# Patient Record
Sex: Male | Born: 1975 | Race: Black or African American | Hispanic: No | Marital: Single | Smoking: Current every day smoker
Health system: Southern US, Community
[De-identification: ages and names within clinical notes are randomized; demographics above are authoritative.]

## PROBLEM LIST (undated history)

## (undated) DIAGNOSIS — W3400XA Accidental discharge from unspecified firearms or gun, initial encounter: Secondary | ICD-10-CM

## (undated) HISTORY — PX: ABDOMINAL SURGERY: SHX537

---

## 1999-11-04 ENCOUNTER — Inpatient Hospital Stay (HOSPITAL_COMMUNITY): Admission: EM | Admit: 1999-11-04 | Discharge: 1999-11-10 | Payer: Self-pay

## 2002-07-19 ENCOUNTER — Emergency Department (HOSPITAL_COMMUNITY): Admission: EM | Admit: 2002-07-19 | Discharge: 2002-07-19 | Payer: Self-pay | Admitting: Emergency Medicine

## 2003-01-06 ENCOUNTER — Emergency Department (HOSPITAL_COMMUNITY): Admission: EM | Admit: 2003-01-06 | Discharge: 2003-01-06 | Payer: Self-pay

## 2003-01-18 ENCOUNTER — Emergency Department (HOSPITAL_COMMUNITY): Admission: EM | Admit: 2003-01-18 | Discharge: 2003-01-18 | Payer: Self-pay | Admitting: Emergency Medicine

## 2003-08-25 ENCOUNTER — Inpatient Hospital Stay (HOSPITAL_COMMUNITY): Admission: AC | Admit: 2003-08-25 | Discharge: 2003-08-29 | Payer: Self-pay

## 2005-06-27 ENCOUNTER — Emergency Department (HOSPITAL_COMMUNITY): Admission: EM | Admit: 2005-06-27 | Discharge: 2005-06-27 | Payer: Self-pay | Admitting: Emergency Medicine

## 2009-07-09 ENCOUNTER — Emergency Department (HOSPITAL_COMMUNITY): Admission: EM | Admit: 2009-07-09 | Discharge: 2009-07-09 | Payer: Self-pay | Admitting: Emergency Medicine

## 2009-08-22 ENCOUNTER — Ambulatory Visit (HOSPITAL_COMMUNITY): Admission: EM | Admit: 2009-08-22 | Discharge: 2009-08-22 | Payer: Self-pay | Admitting: Emergency Medicine

## 2010-03-21 LAB — DIFFERENTIAL
Basophils Absolute: 0.1 10*3/uL (ref 0.0–0.1)
Lymphocytes Relative: 23 % (ref 12–46)
Neutro Abs: 9.1 10*3/uL — ABNORMAL HIGH (ref 1.7–7.7)
Neutrophils Relative %: 66 % (ref 43–77)

## 2010-03-21 LAB — COMPREHENSIVE METABOLIC PANEL
Albumin: 4 g/dL (ref 3.5–5.2)
BUN: 7 mg/dL (ref 6–23)
CO2: 26 mEq/L (ref 19–32)
Chloride: 106 mEq/L (ref 96–112)
Creatinine, Ser: 1.21 mg/dL (ref 0.4–1.5)
GFR calc non Af Amer: >60 mL/min (ref >60)
Total Bilirubin: 1 mg/dL (ref 0.3–1.2)

## 2010-03-21 LAB — CBC
MCH: 33.7 pg (ref 26.0–34.0)
MCV: 97.8 fL (ref 78.0–100.0)
Platelets: 279 10*3/uL (ref 150–400)
RBC: 4.55 MIL/uL (ref 4.22–5.81)

## 2010-03-21 LAB — RAPID URINE DRUG SCREEN, HOSP PERFORMED
Barbiturates: NOT DETECTED
Benzodiazepines: NOT DETECTED
Cocaine: POSITIVE — AB
Opiates: POSITIVE — AB

## 2010-03-23 LAB — GC/CHLAMYDIA PROBE AMP, URINE
Chlamydia, Swab/Urine, PCR: NEGATIVE
GC Probe Amp, Urine: NEGATIVE
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
## 2013-09-18 ENCOUNTER — Emergency Department (HOSPITAL_COMMUNITY): Payer: Self-pay

## 2013-09-18 ENCOUNTER — Emergency Department (HOSPITAL_COMMUNITY)
Admission: EM | Admit: 2013-09-18 | Discharge: 2013-09-18 | Disposition: A | Discharge status: Home / Self Care | Payer: Self-pay | Attending: Emergency Medicine | Admitting: Emergency Medicine

## 2013-09-18 ENCOUNTER — Encounter (HOSPITAL_COMMUNITY): Payer: Self-pay | Admitting: Emergency Medicine

## 2013-09-18 DIAGNOSIS — Y92410 Unspecified street and highway as the place of occurrence of the external cause: Secondary | ICD-10-CM | POA: Insufficient documentation

## 2013-09-18 DIAGNOSIS — F17200 Nicotine dependence, unspecified, uncomplicated: Secondary | ICD-10-CM | POA: Insufficient documentation

## 2013-09-18 DIAGNOSIS — S46909A Unspecified injury of unspecified muscle, fascia and tendon at shoulder and upper arm level, unspecified arm, initial encounter: Secondary | ICD-10-CM | POA: Insufficient documentation

## 2013-09-18 DIAGNOSIS — S4980XA Other specified injuries of shoulder and upper arm, unspecified arm, initial encounter: Secondary | ICD-10-CM | POA: Insufficient documentation

## 2013-09-18 DIAGNOSIS — M25512 Pain in left shoulder: Secondary | ICD-10-CM

## 2013-09-18 DIAGNOSIS — Y9389 Activity, other specified: Secondary | ICD-10-CM | POA: Insufficient documentation

## 2013-09-18 HISTORY — DX: Accidental discharge from unspecified firearms or gun, initial encounter: W34.00XA

## 2013-09-18 MED ORDER — HYDROCODONE-ACETAMINOPHEN 5-325 MG PO TABS: 1.0000 | ORAL_TABLET | Freq: Four times a day (QID) | ORAL | Status: AC | PRN

## 2013-09-18 NOTE — ED Provider Notes (Signed)
Medical screening examination/treatment/procedure(s) were performed by non-physician practitioner and as supervising physician I was immediately available for consultation/collaboration.   EKG Interpretation None        Audree Camel, MD 09/18/13 [phone]

## 2013-09-18 NOTE — Discharge Instructions (Signed)

## 2013-09-18 NOTE — Progress Notes (Signed)
[facility] Community Liaison Stacy, ° °Did not get to see patient but will be sending information about [facility] Orange Card program to help patient establish primary care, using the address provided.  °
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
## 2015-02-03 ENCOUNTER — Emergency Department (HOSPITAL_COMMUNITY)
Admission: EM | Admit: 2015-02-03 | Discharge: 2015-02-03 | Disposition: A | Discharge status: Home / Self Care | Payer: Self-pay | Attending: Emergency Medicine | Admitting: Emergency Medicine

## 2015-02-03 ENCOUNTER — Encounter (HOSPITAL_COMMUNITY): Payer: Self-pay

## 2015-02-03 DIAGNOSIS — Z87828 Personal history of other (healed) physical injury and trauma: Secondary | ICD-10-CM | POA: Insufficient documentation

## 2015-02-03 DIAGNOSIS — F17210 Nicotine dependence, cigarettes, uncomplicated: Secondary | ICD-10-CM | POA: Insufficient documentation

## 2015-02-03 DIAGNOSIS — L259 Unspecified contact dermatitis, unspecified cause: Secondary | ICD-10-CM | POA: Insufficient documentation

## 2015-02-03 MED ORDER — FAMOTIDINE 20 MG PO TABS
40.0000 mg | ORAL_TABLET | Freq: Once | ORAL | Status: AC
  Administered 2015-02-03: 40 mg via ORAL
  Filled 2015-02-03: qty 2

## 2015-02-03 MED ORDER — PREDNISONE 20 MG PO TABS: 40.0000 mg | ORAL_TABLET | Freq: Every day | ORAL | Status: AC

## 2015-02-03 MED ORDER — DIPHENHYDRAMINE HCL 25 MG PO CAPS
25.0000 mg | ORAL_CAPSULE | Freq: Once | ORAL | Status: AC
  Administered 2015-02-03: 25 mg via ORAL
  Filled 2015-02-03: qty 1

## 2015-02-03 MED ORDER — PREDNISONE 20 MG PO TABS
60.0000 mg | ORAL_TABLET | Freq: Once | ORAL | Status: AC
  Administered 2015-02-03: 60 mg via ORAL
  Filled 2015-02-03: qty 3

## 2015-02-03 MED ORDER — FAMOTIDINE 20 MG PO TABS: 20.0000 mg | ORAL_TABLET | Freq: Two times a day (BID) | ORAL | Status: AC | PRN

## 2015-02-03 NOTE — Discharge Instructions (Signed)
Read instructions below to learn more about your diagnosis and for reasons to return to the ED. Follow up with your doctor if symptoms are not improving after 3-4 days. If you do not have a doctor use the resource guide listed below to help he find one. You may return to the emergency department if symptoms worsen, become progressive, or become more concerning. Take prednisone as directed. Take benadryl and pepcid as needed for swelling and/or itching.   Allergic Reaction  There are many allergens around [place]. It may be difficult to know what caused your reaction. You may follow up with an allergy specialist for further testing to learn more about your specific allergies.   TREATMENT AND HOME CARE INSTRUCTIONS  If hives or rash are present:  Use an over-the-counter antihistamine (Benadryl or Zyrtec) for hives and itching as needed. Do not drive or drink alcohol until medications used to treat the reaction have worn off. Antihistamines tend to make people sleepy.  Apply cold cloths (compresses) to the skin or take baths in cool water. This will help itching. Avoid hot baths or showers. Heat will make a rash and itching worse.  See Your Primary Care Doctor if:  Your allergies are becoming progressively more troublesome.  You suspect a food allergy. Symptoms generally happen within 30 minutes of eating a food.  Your symptoms have not gone away within 2 days.  SEEK IMMEDIATE MEDICAL CARE IF:  You develop difficulty breathing or wheezing, or have a tight feeling in your chest or throat (feeling like your throat is closing) You develop swollen lips or tongue You develop hives on your chest, neck or face. You are unable to swallow fluids or salvia secretions.   A severe reaction with any of the above problems should be considered life-threatening. If you suddenly develop difficulty breathing call for local emergency medical help. THIS IS AN EMERGENCY.    Emergency Department Resource Guide 1) Find a  Doctor and Pay Out of Pocket Although you won't have to find out who is covered by your insurance plan, it is a good idea to ask around and get recommendations. You will then need to call the office and see if the doctor you have chosen will accept you as a new patient and what types of options they offer for patients who are self-pay. Some doctors offer discounts or will set up payment plans for their patients who do not have insurance, but you will need to ask so you aren't surprised when you get to your appointment.  2) Contact Your Local Health Department Not all health departments have doctors that can see patients for sick visits, but many do, so it is worth a call to see if yours does. If you don't know where your local health department is, you can check in your phone book. The CDC also has a tool to help you locate your state's health department, and many state websites also have listings of all of their local health departments.  3) Find a Walk-in Clinic If your illness is not likely to be very severe or complicated, you may want to try a walk in clinic. These are popping up all over the country in pharmacies, drugstores, and shopping centers. They're usually staffed by nurse practitioners or physician assistants that have been trained to treat common illnesses and complaints. They're usually fairly quick and inexpensive. However, if you have serious medical issues or chronic medical problems, these are probably not your best option.  No Primary  Care Doctor: - Call Health Connect at  [phone] - they can help you locate a primary care doctor that  accepts your insurance, provides certain services, etc. - Physician Referral Service- [phone]  Chronic Pain Problems: Organization         Address  Phone   Notes  [facility]  [phone] Patients need to be referred by their primary care doctor.   Medication Assistance: Organization         Address  Phone    Notes  [facility] Medication [facility] [address] [place] [place], [place] [address] [phone] --Must be a resident of [facility] -- Must have NO insurance coverage whatsoever (no Medicaid/ Medicare, etc.) -- The pt. MUST have a primary care doctor that directs their care regularly and follows them in the community   [facility]ist  [phone]   [employer]  [phone]    Agencies that provide inexpensive medical care: Organization         Address  Phone   Notes  [facility]  [phone]   [facility]    [phone]   [facility] [address] [place], [place] [address] [phone]   [facility] [address] [place] [place] [phone]   Planned Parenthood    [phone]   [facility]    [phone]   [facility]  [address], [place] Phone:  [phone], Fax:  [phone] Hours of Operation:  9 am - 6 pm, M-F.  Also accepts Medicaid/Medicare and self-pay.  [facility]  [address], [place] Phone: [phone], Fax: [phone]. Hours of Operation:  8:30 am - 5:30 pm, M-F.  Also accepts Medicaid and self-pay.  [facility] [place] [address] [place] Phone: [phone]   [facility] [address] Natasha Bence [place], [place] [phone], Ext. [phone] Mondays & Thursdays: 7-9 AM.  First 15 patients are seen on a first come, first serve basis.    Medicaid-accepting [facility] Providers:  Organization         Address  Phone   Notes  [facility] [address], [place] [phone] Also accepts self-pay patients.  [facility] [address] Laurell Josephs [place], [place]  [phone]   [facility] [address], [place] [phone]   [facility] [address] [place] [phone]   Renaye Rakers [address], [place]   [phone] Only accepts [place] Access [place] patients after they have their name applied to their card.   Self-Pay (no insurance) in [facility]:  Organization         Address  Phone   Notes  Sickle Cell Patients, [facility] [address] [place], [place] [phone]   [facility] [address] [place], [place] [phone]   [facility] [place]  [address], [place] [phone]   [facility]/Dr. Osei-Bonsu  [address], [place] or [address], [place] [phone] Phone number for both [place] and [place] locations is the same.  [facility] [address], [place] Otto [phone]  [facility] [place] [address] [place] [address], [place] or [address] [phone]   [facility] [address] [place], [place] [phone], phone; [phone]) [phone], fax Sees patients 1st and 3rd Saturday of every month.  Must not qualify for public or private insurance (i.e. Medicaid, Medicare, [place] Health Choice, Veterans' Benefits)  Household income should be no more than 200% of the poverty level The clinic cannot treat you if you are pregnant or think you are pregnant  Sexually transmitted diseases are not treated at the clinic.    Dental Care: Organization         Address  Phone  Notes  [facility] [address], [place] [phone]) [phone] Accepts children up to age 42 who are enrolled in [place] or [place] Health Choice; pregnant women with a Medicaid card; and children who have applied for Medicaid or [place] Health Choice, but were declined, whose parents can pay a reduced fee at time of service.  [facility] [place]  [address], [place] [phone]) [phone] Accepts children up to age 42 who are enrolled in [place] or [place] Health Choice; pregnant women with a Medicaid card; and children who have applied for Medicaid or [place] Health Choice, but were declined, whose parents can pay a reduced fee at time of service.  [facility]  [address], [place] [phone] Patients are seen by appointment only. Walk-ins are not accepted. [facility] will see patients 518 years of age and older. Monday - Tuesday (8am-5pm) Most Wednesdays (8:30-5pm) $30 per visit, cash only  [facility]  [address], [facility] [phone] Patients are seen by appointment only. Walk-ins are not accepted. [facility] will see patients 42 years of age and older. One Wednesday Evening (Monthly: Volunteer Based).  $30 per visit, cash only  [employer] [facility] [facility]  [phone] for adults; Children under age 364, call Graduate Pediatric Dentistry at [phone]. Children aged 694-14, please call [phone] to request a pediatric application.  Dental services are provided in all areas of dental care including fillings, crowns and bridges, complete and partial dentures, implants, gum treatment, root canals, and extractions. Preventive care is also provided. Treatment is provided to both adults and children. Patients are selected via a lottery and there is often a waiting list.   [facility] [address], [place]  [phone] [email or web address]   [facility] [address], [place], [place] [phone], Ext. [phone] Second and Fourth Thursday of each month, opens at 6:30 AM; Clinic ends at 9 AM.  Patients are seen on a first-come first-served basis, and a limited number are seen during each clinic.   [facility]  [address], [place], [place] [phone]) [phone]   Eligibility Requirements You must have lived in  [place], [place], or [place] counties for at least the last three months.   You cannot be eligible for state or federal sponsored National Cityhealthcare insurance, including CIGNAVeterans Administration, [place]icaid, or [employer]icare.   You generally cannot be eligible for healthcare insurance through your employer.    How to apply: Eligibility screenings are held every Tuesday and Wednesday afternoon from 1:00 pm until 4:00 pm. You do not need an appointment for the interview!  [facility] [address], [place], [place] [phone]  [place]  [place] Department  [place]  [phone]    Behavioral Health Resources in the Community: Intensive Outpatient Programs Organization         Address  Phone  Notes  [place]. [address], [place], [place] [phone]   [facility] Outpatient [address], [place], [place]   ADS: Alcohol & Drug Svcs [address], [place], [place]   [place] [address]  [place], [place] or [phone]   Substance Abuse Resources Organization         Address  Phone  Notes  Alcohol and Drug Services  [phone]   [place]  [phone]   The [place]   Chinita Pester  [phone]   Residential & Outpatient Substance Abuse Program  [phone]   Psychological Services Organization         Address  Phone  Notes  [facility]  [place]  [phone]   [place] [address] [place] or [phone]    Mobile Crisis Teams Organization         Address  Phone  Notes  [facility], Mobile Crisis Care Unit  [phone]   [facility]  [address] [place], [place]   [place] Levels [address] [place] [phone]    Self-Help/Support Groups Organization         Address  Phone             Notes  [place]. of [place] - variety of support groups  [place] Call for more information  Narcotics Anonymous (NA), [facility] [address], [employer] [place]  2 meetings at this location   Special educational needs teacher         Address  Phone  Notes  [facility] [place],    [place]  [phone]   [facility]  [address], [place] [address], [place], [place]   [place], [place] [phone] Admissions: 8am-3pm M-F  Incentives Substance [place] [address]    [place], [place] [phone]   [facility] [address] [place], [place], [place]   [facility] [address]  [place], [place]   [facility] Programs - Intensive Outpatient [address] Kristeen Mans [address], [place], [place]   [facility] [place].,  [place], [place] [phone] or [phone]   [facility] ([facility]) [address] [place], [place] Accepts Medicaid  [facility] [place] [address]  [place] [phone] Substance Abuse/Addiction Treatment   [facility] Organization         Address  Phone  Notes  [facility]  [phone]   Domenic Schwab, PhD [address] Arlis Porta [place], [place]   [phone] or [phone]   [facility] [place] [place], [place] [phone]   [place] [address] [place], [place] [phone] Insurance/Medicaid/sponsorship through [employer] and Families [address] [place] [address]  [place], [place] [phone] [place], [place] [phone]    Dr. Adele Schilder  [phone]   [facility] Dept. 1) [address] [place] 2) [place] 3)  [place] 65, [place] [phone]  [phone]   [place] [phone] or [phone] (After Hours)

## 2015-02-03 NOTE — ED Notes (Signed)
Pt c/o generalized upper body rash x 2 days.  Pt reports that he thinks that he got into poison oak or poison ivy.  Sts he has been taking Benadryl which has relieved itching.
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
## 2015-02-03 NOTE — ED Notes (Signed)
Pt verbalized understanding d/c instructions. 

## 2015-06-26 IMAGING — CR DG SHOULDER 2+V*L*
3 series · 3 of 3 positions shown · non-contrast
Comparison: None.

CLINICAL DATA: Superior left shoulder pain, trauma

EXAM:
LEFT SHOULDER - 2+ VIEW

[w shoulder internal left]
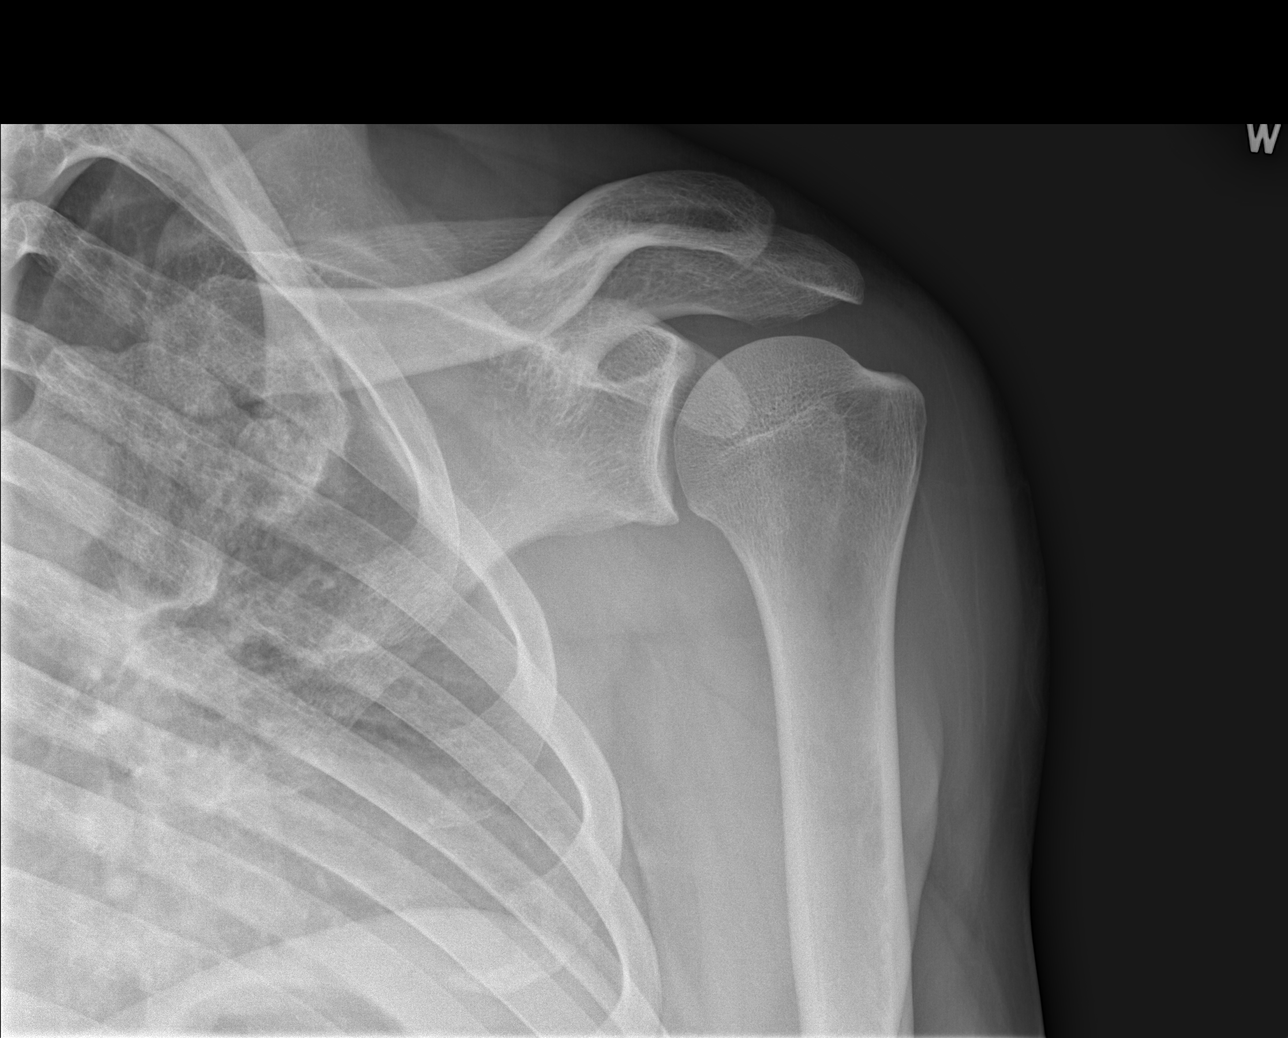

[w shoulder y-view left]
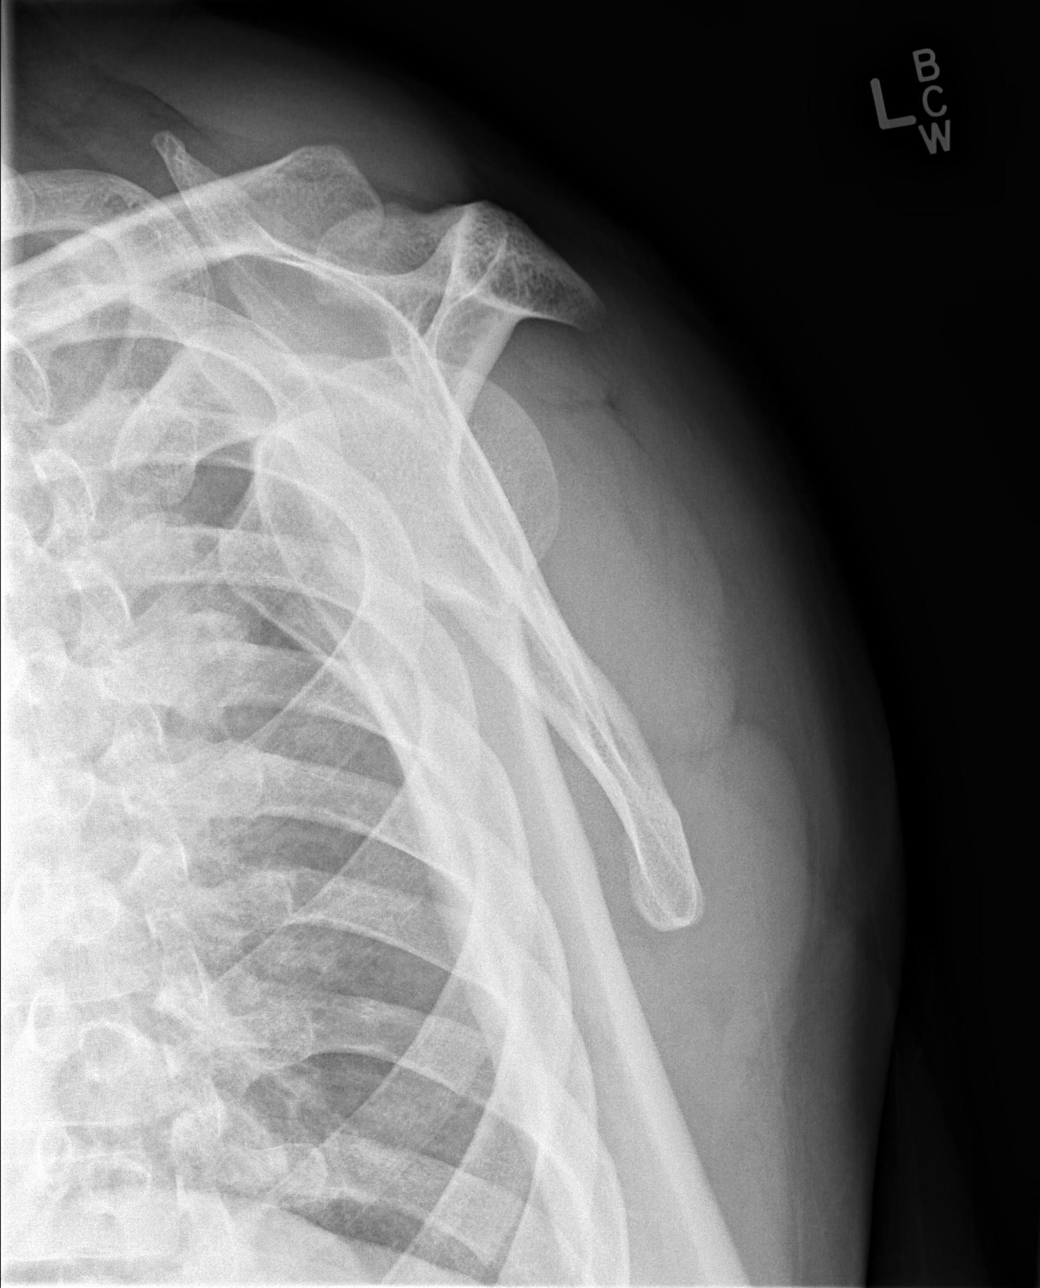

[x shoulder axillary left]
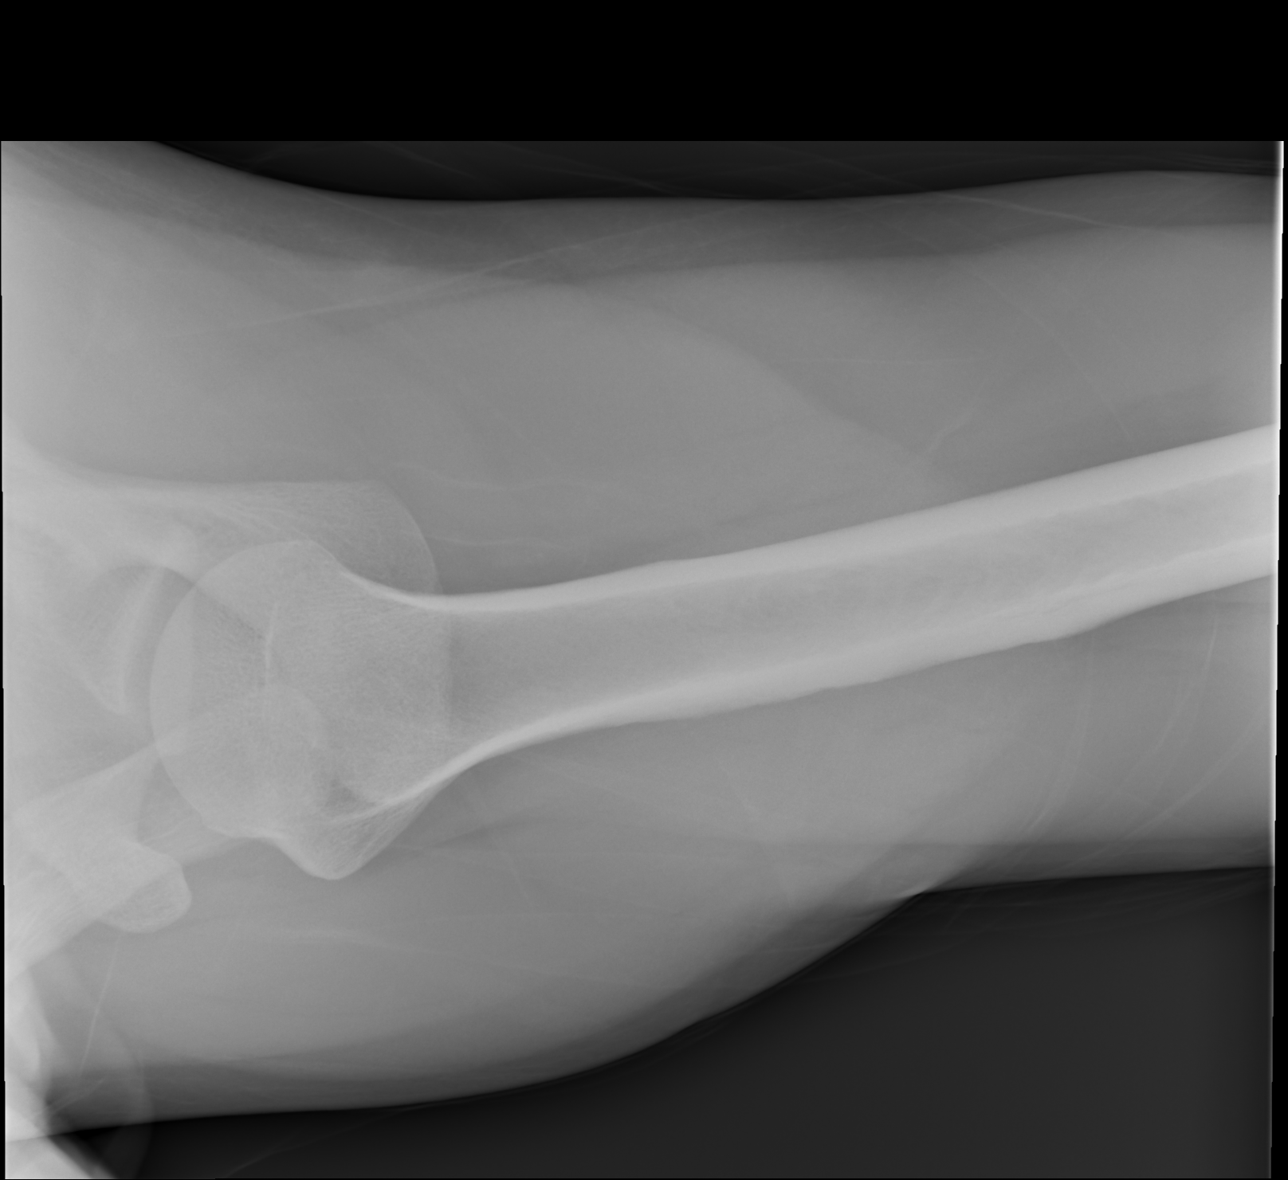

[3 of 3 positions shown; findings below may reference images not displayed]

FINDINGS: There is no evidence of fracture or dislocation. There is no
evidence of arthropathy or other focal bone abnormality. Soft
tissues are unremarkable.
IMPRESSION: Negative.

## 2022-08-27 ENCOUNTER — Emergency Department (HOSPITAL_COMMUNITY): Payer: 59

## 2022-08-27 ENCOUNTER — Other Ambulatory Visit: Payer: Self-pay

## 2022-08-27 ENCOUNTER — Emergency Department (HOSPITAL_COMMUNITY)
Admission: EM | Admit: 2022-08-27 | Discharge: 2022-08-27 | Disposition: A | Discharge status: Home / Self Care | Payer: 59 | Attending: Emergency Medicine | Admitting: Emergency Medicine

## 2022-08-27 ENCOUNTER — Encounter (HOSPITAL_COMMUNITY): Payer: Self-pay

## 2022-08-27 DIAGNOSIS — M25512 Pain in left shoulder: Secondary | ICD-10-CM | POA: Diagnosis present

## 2022-08-27 MED ORDER — KETOROLAC TROMETHAMINE 15 MG/ML IJ SOLN
15.0000 mg | Freq: Once | INTRAMUSCULAR | Status: AC
  Administered 2022-08-27: 15 mg via INTRAMUSCULAR
  Filled 2022-08-27: qty 1

## 2022-08-27 MED ORDER — LIDOCAINE 5 % EX PTCH
1.0000 | MEDICATED_PATCH | Freq: Once | CUTANEOUS | Status: DC
  Administered 2022-08-27: 1 via TRANSDERMAL
  Filled 2022-08-27: qty 1

## 2022-08-27 MED ORDER — ACETAMINOPHEN 500 MG PO TABS
1000.0000 mg | ORAL_TABLET | Freq: Once | ORAL | Status: AC
  Administered 2022-08-27: 1000 mg via ORAL
  Filled 2022-08-27: qty 2

## 2022-08-27 MED ORDER — LIDOCAINE 5 % EX PTCH: 1.0000 | MEDICATED_PATCH | CUTANEOUS | 0 refills | Status: AC

## 2022-08-27 NOTE — ED Provider Notes (Signed)
[place] EMERGENCY DEPARTMENT AT [facility] Provider Note   CSN: [id number] Arrival date & time: 08/27/22  1113     History  Chief Complaint  Patient presents with   Shoulder Pain    Timothy Brooks is a 47 y.o. male who presents emergency department concerns for left shoulder pain onset 4 days.  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  Tried Bayer at home for his symptoms without relief. Denies any known injury.  Denies chest pain, shortness of breath.  The history is provided by the patient. No language interpreter was used.       Home Medications Prior to Admission medications   Medication Sig Start Date End Date Taking? Authorizing Provider  lidocaine (LIDODERM) 5 % Place 1 patch onto the skin daily. Remove & Discard patch within 12 hours or as directed by MD 08/27/22  Yes Deago Burruss A, PA-C  famotidine (PEPCID) 20 MG tablet Take 1 tablet (20 mg total) by mouth 2 (two) times daily as needed for heartburn or indigestion. 02/03/15   Ward, Chase Picket, PA-C  HYDROcodone-acetaminophen (NORCO/VICODIN) 5-325 MG per tablet Take 1 tablet by mouth every 6 (six) hours as needed for moderate pain or severe pain. 09/18/13   Shirleen Schirmer, PA-C  predniSONE (DELTASONE) 20 MG tablet Take 2 tablets (40 mg total) by mouth daily. 02/03/15   Ward, Chase Picket, PA-C      Allergies    Patient has no known allergies.    Review of Systems   Review of Systems  All other systems reviewed and are negative.   Physical Exam Updated Vital Signs BP (!) 151/105   Pulse 82   Temp 98.2 F (36.8 C) (Oral)   Resp 16   Ht 6\' 2"  (1.88 m)   Wt 95.3 kg   SpO2 100%   BMI 26.96 kg/m  Physical Exam Vitals and nursing note reviewed.  Constitutional:      General: He is not in acute distress.    Appearance: Normal appearance.  Eyes:     General: No scleral icterus.    Extraocular Movements: Extraocular movements intact.   Cardiovascular:     Rate and Rhythm: Normal rate.  Pulmonary:     Effort: Pulmonary effort is normal. No respiratory distress.  Abdominal:     Palpations: Abdomen is soft. There is no mass.     Tenderness: There is no abdominal tenderness.  Musculoskeletal:        General: Normal range of motion.     Cervical back: Neck supple.     Comments: Mild TTP noted to left anterior shoulder, left upper trapezius. No spinal TTP. No TTP noted to remainder of musculature of back. Normal range of motion of left olecranon without difficulty.   Skin:    General: Skin is warm and dry.     Findings: No rash.  Neurological:     Mental Status: He is alert.     Sensory: Sensation is intact.     Motor: Motor function is intact.  Psychiatric:        Behavior: Behavior normal.     ED Results / Procedures / Treatments   Labs (all labs ordered are listed, but only abnormal results are displayed) Labs Reviewed - No data to display  EKG None  Radiology DG Shoulder Left  Result Date: 08/27/2022 CLINICAL DATA:  Left-sided shoulder pain for 4 days with radiation to the neck and forearm.  EXAM: LEFT SHOULDER - 3 VIEW COMPARISON:  X-ray 09/18/2013 FINDINGS: There is no evidence of fracture or dislocation. There is no evidence of arthropathy or other focal bone abnormality. Soft tissues are unremarkable. IMPRESSION: No acute osseous abnormality. Electronically Signed   By: Karen Kays M.D.   On: 08/27/2022 12:56    Procedures Procedures    Medications Ordered in ED Medications  ketorolac (TORADOL) 15 MG/ML injection 15 mg (15 mg Intramuscular Given 08/27/22 1236)  acetaminophen (TYLENOL) tablet 1,000 mg (1,000 mg Oral Given 08/27/22 1359)    ED Course/ Medical Decision Making/ A&P Clinical Course as of 08/27/22 2037  Thu Aug 27, 2022  1337 Re-evaluated and noted pain still at this time. Discussed with patient imaging findings. Offered tylenol, patient agreeable at this time.  [SB]  1427 Re-evaluated  and discussed with him discharge treatment plan. Answered all available questions. Pt appears safe for discharge at this time.  [SB]    Clinical Course User Index [SB] Whitt Auletta A, PA-C                                 Medical Decision Making Amount and/or Complexity of Data Reviewed Radiology: ordered.  Risk OTC drugs. Prescription drug management.   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 Vital signs pt afebrile. On exam, patient with Mild TTP noted to left anterior shoulder, left upper trapezius. No spinal TTP. No TTP noted to remainder of musculature of back. Normal range of motion of left olecranon without difficulty. Differential diagnosis includes effusion, fracture, dislocation, sprain.  Imaging: I ordered imaging studies including left shoulder xray I independently visualized and interpreted imaging which showed: no acute findings I agree with the radiologist interpretation  Medications:  I ordered medication including toradol, warm compress, lidoderm patch for symptom management I have reviewed the patients home medicines and have made adjustments as needed   Disposition: Presentation suspicious for left shoulder pain in the setting of increased working while landscaping. shoulder xray negative for acute fractures or dislocations. After consideration of the diagnostic results and the patients response to treatment, I feel that the patient would benefit from Discharge home.  Offered work note, patient declined. Patient provided with information for orthopedics for follow-up regarding today's ED visit.  Lidoderm patch Rx sent. Supportive care measures and strict return precautions discussed with patient at bedside. Pt acknowledges and verbalizes understanding. Pt appears safe for discharge. Follow up as indicated in discharge paperwork.    This chart was dictated using voice recognition software, Dragon. Despite the best  efforts of this provider to proofread and correct errors, errors may still occur which can change documentation meaning.   Final Clinical Impression(s) / ED Diagnoses Final diagnoses:  Acute pain of left shoulder    Rx / DC Orders ED Discharge Orders          Ordered    lidocaine (LIDODERM) 5 %  Every 24 hours        08/27/22 1440              Emmeline Winebarger A, PA-C 08/27/22 2037    Arby Barrette, MD 09/06/22 1656

## 2022-08-27 NOTE — Discharge Instructions (Addendum)
Your x-ray was negative for fracture or dislocation.  You may take over the counter 600 mg Ibuprofen every 6 hours and alternate with 500 mg Tylenol every 6 hours as needed for pain for no more than 7 days. You will be given a prescription for a lidoderm patch, remove and replace with a new patch every 12 hours. You may apply ice or heat to affected area for up to 15 minutes at a time. Ensure to place a barrier between your skin and the ice/heat.  Attached is information for the on-call Orthopedist, you may call and set up a follow up appointment regarding todays ED visit. You may follow-up with your primary care provider as needed.  Return to the Emergency Department if you are experiencing increasing/worsening symptoms.

## 2022-08-27 NOTE — ED Triage Notes (Signed)
Left shoulder pain that started 4 days ago and radiates into forearm. 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 Denies known injury. Sensation intact, has some tingling.
